# Patient Record
Sex: Male | Born: 1990 | Race: White | Hispanic: No | Marital: Single | Smoking: Former smoker
Health system: Southern US, Community
[De-identification: ages and names within clinical notes are randomized; demographics above are authoritative.]

## PROBLEM LIST (undated history)

## (undated) DIAGNOSIS — F32A Depression, unspecified: Secondary | ICD-10-CM

## (undated) DIAGNOSIS — F329 Major depressive disorder, single episode, unspecified: Secondary | ICD-10-CM

## (undated) DIAGNOSIS — K219 Gastro-esophageal reflux disease without esophagitis: Secondary | ICD-10-CM

## (undated) HISTORY — DX: Depression, unspecified: F32.A

## (undated) HISTORY — DX: Gastro-esophageal reflux disease without esophagitis: K21.9

## (undated) HISTORY — DX: Major depressive disorder, single episode, unspecified: F32.9

---

## 2017-08-30 ENCOUNTER — Encounter (HOSPITAL_COMMUNITY): Payer: Self-pay

## 2017-08-30 ENCOUNTER — Emergency Department (HOSPITAL_COMMUNITY)
Admission: EM | Admit: 2017-08-30 | Discharge: 2017-08-30 | Disposition: A | Discharge status: Home / Self Care | Payer: No Typology Code available for payment source | Attending: Emergency Medicine | Admitting: Emergency Medicine

## 2017-08-30 ENCOUNTER — Emergency Department (HOSPITAL_COMMUNITY): Payer: No Typology Code available for payment source

## 2017-08-30 DIAGNOSIS — F17210 Nicotine dependence, cigarettes, uncomplicated: Secondary | ICD-10-CM | POA: Insufficient documentation

## 2017-08-30 DIAGNOSIS — R0781 Pleurodynia: Secondary | ICD-10-CM

## 2017-08-30 DIAGNOSIS — M79641 Pain in right hand: Secondary | ICD-10-CM | POA: Diagnosis not present

## 2017-08-30 DIAGNOSIS — R0789 Other chest pain: Secondary | ICD-10-CM | POA: Diagnosis not present

## 2017-08-30 DIAGNOSIS — M25511 Pain in right shoulder: Secondary | ICD-10-CM | POA: Insufficient documentation

## 2017-08-30 MED ORDER — IBUPROFEN 800 MG PO TABS
800.0000 mg | ORAL_TABLET | Freq: Once | ORAL | Status: AC
  Administered 2017-08-30: 800 mg via ORAL
  Filled 2017-08-30: qty 1

## 2017-08-30 MED ORDER — IBUPROFEN 800 MG PO TABS: 800.0000 mg | ORAL_TABLET | Freq: Three times a day (TID) | ORAL | 0 refills | Status: DC
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
## 2018-06-29 ENCOUNTER — Encounter: Payer: Self-pay | Admitting: Family Medicine

## 2018-06-29 ENCOUNTER — Other Ambulatory Visit: Payer: Self-pay

## 2018-06-29 ENCOUNTER — Ambulatory Visit: Payer: No Typology Code available for payment source | Admitting: Family Medicine

## 2018-06-29 VITALS — BP 120/68 | HR 69 | Temp 98.0°F | Ht 70.0 in | Wt 195.0 lb

## 2018-06-29 DIAGNOSIS — R3589 Other polyuria: Secondary | ICD-10-CM | POA: Insufficient documentation

## 2018-06-29 DIAGNOSIS — K219 Gastro-esophageal reflux disease without esophagitis: Secondary | ICD-10-CM | POA: Insufficient documentation

## 2018-06-29 DIAGNOSIS — F3341 Major depressive disorder, recurrent, in partial remission: Secondary | ICD-10-CM | POA: Diagnosis not present

## 2018-06-29 DIAGNOSIS — R358 Other polyuria: Secondary | ICD-10-CM | POA: Diagnosis not present

## 2018-06-29 DIAGNOSIS — F329 Major depressive disorder, single episode, unspecified: Secondary | ICD-10-CM | POA: Insufficient documentation

## 2018-06-29 LAB — POCT URINALYSIS DIPSTICK
BILIRUBIN UA: NEGATIVE
Blood, UA: NEGATIVE
Glucose, UA: NEGATIVE
Leukocytes, UA: NEGATIVE
Nitrite, UA: NEGATIVE
PH UA: 6 (ref 5.0–8.0)
Protein, UA: NEGATIVE
Spec Grav, UA: 1.025 (ref 1.010–1.025)
UROBILINOGEN UA: 1 U/dL

## 2018-06-29 LAB — HEMOGLOBIN A1C: Hgb A1c MFr Bld: 5 % (ref 4.6–6.5)

## 2018-06-29 MED ORDER — OMEPRAZOLE 10 MG PO CPDR: 10.0000 mg | DELAYED_RELEASE_CAPSULE | Freq: Every day | ORAL | 2 refills | Status: DC

## 2018-06-29 MED ORDER — ESCITALOPRAM OXALATE 20 MG PO TABS: 20.0000 mg | ORAL_TABLET | Freq: Every day | ORAL | 2 refills | Status: DC

## 2018-06-29 NOTE — Progress Notes (Signed)
Alexander Arroyo [place] - 27 y.o. male MRN [id number]  Date of birth: 06/25/1991  Subjective Chief Complaint  Patient presents with  . Establish Care    trouble sleeping at night bc has to use bathroom often, needs to est care and get a new primary so meds can get refilled. Would like to discuss his meds.    HPI Alexander Arroyo D[place] is a 27 y.o. male with history of GERD and Depression here today to establish care with new PCP.  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  He does report increased urination in the evenings as well.  Denies increased thirst but reports diabetes in several family members.    -Depression:  Current treatment with lexapro.  He is doing well on this and denies side effects.  He reports that he does have some days where he feels sad and depressed.  Denies any changes to sleep or energy levels.  Appetite is good.  Denies SI/HI  -GERD:  Managed well with omeprazole.  Denies increased symptoms of reflux, abdominal pain, nausea or vomiting, or melena.    ROS:  A comprehensive ROS was completed and negative except as noted per HPI   Depression screen [facility] 2/9 06/29/2018 06/29/2018  Decreased Interest 2 0  Down, Depressed, Hopeless 1 0  PHQ - 2 Score 3 0  Altered sleeping 1 -  Tired, decreased energy 2 -  Change in appetite 0 -  Feeling bad or failure about yourself  1 -  Trouble concentrating 3 -  Moving slowly or fidgety/restless 0 -  Suicidal thoughts 0 -  PHQ-9 Score 10 -   GAD 7 : Generalized Anxiety Score 06/29/2018  Nervous, Anxious, on Edge 1  Control/stop worrying 1  Worry too much - different things 3  Trouble relaxing 2  Restless 0  Easily annoyed or irritable 2  Afraid - awful might happen 1  Total GAD 7 Score 10    . No Known Allergies  Past Medical History:  Diagnosis Date  . Depression   . GERD (gastroesophageal reflux disease)     History reviewed. No pertinent surgical history.  Social History   Socioeconomic History  . Marital  status: Single    Spouse name: Not on file  . Number of children: Not on file  . Years of education: Not on file  . Highest education level: Not on file  Occupational History  . Not on file  Social Needs  . Financial resource strain: Not on file  . Food insecurity:    Worry: Not on file    Inability: Not on file  . Transportation needs:    Medical: Not on file    Non-medical: Not on file  Tobacco Use  . Smoking status: Former Smoker    Packs/day: 0.50    Types: Cigarettes  . Smokeless tobacco: Never Used  Substance and Sexual Activity  . 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 Sexual activity: Not on file  Lifestyle  . Physical activity:    Days per week: Not on file    Minutes per session: Not on file  . Stress: Not on file  Relationships  . Social connections:    Talks on phone: Not on file    Gets together: Not on file    Attends religious service: Not on file  Active member of club or organization: Not on file    Attends meetings of clubs or organizations: Not on file    Relationship status: Not on file  Other Topics Concern  . Not on file  Social History Narrative  . Not on file    History reviewed. No pertinent family history.  Health Maintenance  Topic Date Due  . HIV Screening  02/15/2007  . TETANUS/TDAP  02/15/2011  . INFLUENZA VACCINE  06/15/2018    ----------------------------------------------------------------------------------------------------------------------------------------------------------------------------------------------------------------- Physical Exam BP 120/68 (BP Location: Left Arm, Patient Position: Sitting, Cuff Size: Normal)   Pulse 69   Temp 98 F (36.7 C) (Oral)   Ht 5\' 10"  (1.778 m)   Wt 195 lb (88.5 kg)   SpO2 97%   BMI 27.98 kg/m   Physical Exam  Constitutional: He is oriented to person, place, and time. He appears  well-nourished. No distress.  HENT:  Head: Normocephalic and atraumatic.  Mouth/Throat: Oropharynx is clear and moist.  Eyes: No scleral icterus.  Neck: Neck supple. No thyromegaly present.  Cardiovascular: Normal rate, regular rhythm and normal heart sounds.  Pulmonary/Chest: Effort normal and breath sounds normal.  Musculoskeletal: He exhibits no edema.  Lymphadenopathy:    He has no cervical adenopathy.  Neurological: He is alert and oriented to person, place, and time. No cranial nerve deficit.  Skin: Skin is warm and dry.  Psychiatric: He has a normal mood and affect. His behavior is normal.    ------------------------------------------------------------------------------------------------------------------------------------------------------------------------------------------------------------------- Assessment and Plan  MDD (major depressive disorder) Stable with lexapro, continue at current dose.    Polyuria Check UA and A1c  Gastroesophageal reflux disease without esophagitis Stable symptoms on omeprazole, recommend continuation.

## 2018-06-29 NOTE — Assessment & Plan Note (Signed)
Check UA and A1c

## 2018-06-29 NOTE — Patient Instructions (Signed)
Continue current medications See me again in about 6 months or sooner if [facility]

## 2018-06-29 NOTE — Assessment & Plan Note (Signed)
Stable with lexapro, continue at current dose.

## 2018-06-29 NOTE — Assessment & Plan Note (Signed)
Stable symptoms on omeprazole, recommend continuation.

## 2018-07-04 ENCOUNTER — Ambulatory Visit: Payer: Commercial Managed Care - PPO | Admitting: Family Medicine

## 2018-07-04 ENCOUNTER — Encounter: Payer: Self-pay | Admitting: Family Medicine

## 2018-07-04 VITALS — BP 120/78 | HR 77 | Temp 97.9°F | Ht 70.0 in | Wt 195.0 lb

## 2018-07-04 DIAGNOSIS — L03012 Cellulitis of left finger: Secondary | ICD-10-CM | POA: Insufficient documentation

## 2018-07-04 DIAGNOSIS — Z23 Encounter for immunization: Secondary | ICD-10-CM | POA: Diagnosis not present

## 2018-07-04 MED ORDER — CEPHALEXIN 500 MG PO CAPS: 500.0000 mg | ORAL_CAPSULE | Freq: Three times a day (TID) | ORAL | 0 refills | Status: DC

## 2018-07-04 NOTE — Patient Instructions (Signed)
Paronychia  Paronychia is an infection of the skin. It happens near a fingernail or toenail. It may cause pain and swelling around the nail. Usually, it is not serious and it clears up with treatment.  Follow these instructions at home:   Soak the fingers or toes in warm water as told by your doctor. You may be told to do this for 20 minutes, 2-3 times a day.   Keep the area dry when you are not soaking it.   Take medicines only as told by your doctor.   If you were given an antibiotic medicine, finish all of it even if you start to feel better.   Keep the affected area clean.   Do not try to drain a fluid-filled bump yourself.   Wear rubber gloves when putting your hands in water.   Wear gloves if your hands might touch cleaners or chemicals.   Follow your doctor's instructions about:  ? Wound care.  ? Bandage (dressing) changes and removal.  Contact a doctor if:   Your symptoms get worse or do not improve.   You have a fever or chills.   You have redness spreading from the affected area.   You have more fluid, blood, or pus coming from the affected area.   Your finger or knuckle is swollen or is hard to move.  This information is not intended to replace advice given to you by your health care provider. Make sure you discuss any questions you have with your health care provider.  Document Released: 10/20/2009 Document Revised: 04/08/2016 Document Reviewed: 10/09/2014  Elsevier Interactive Patient Education  2018 [employer]

## 2018-07-04 NOTE — Assessment & Plan Note (Addendum)
No improvement with conservative treatment (soaks) at home.  Simple I&D performed today Rx for cephalexin given surrounding cellulitis.  Updated Tdap given.

## 2018-07-04 NOTE — Addendum Note (Signed)
Addended by: Mammie LorenzoMATTHEWS, Tyrel E on: 07/04/2018 09:09 AM   Modules accepted: Orders

## 2018-07-04 NOTE — Progress Notes (Signed)
Alexander Arroyo - 27 y.o. male MRN [id number]  Date of birth: 06/07/1991  Subjective Chief Complaint  Patient presents with  . thumb infection    last friday trried to relieve the pain in his left thumb, now its infected and red, swollen    HPI Alexander Arroyo is a 27 y.o. male here today with complaint of L thumb infection.  Reports that symptoms began last Friday and has progressively become more swollen and tender.  He has tried warm soaks and "popping" area himself without success.  He does have surrounding erythema.  He is unsure if he injured/cut his thumb on anything.  He is unsure of tetanus vaccination status. He denies fever, chills.   ROS:  A comprehensive ROS was completed and negative except as noted per HPI  No Known Allergies  Past Medical History:  Diagnosis Date  . Depression   . GERD (gastroesophageal reflux disease)     No past surgical history on file.  Social History   Socioeconomic History  . Marital status: Single    Spouse name: Not on file  . Number of children: Not on file  . Years of education: Not on file  . Highest education level: Not on file  Occupational History  . Not on file  Social Needs  . Financial resource strain: Not on file  . Food insecurity:    Worry: Not on file    Inability: Not on file  . Transportation needs:    Medical: Not on file    Non-medical: Not on file  Tobacco Use  . Smoking status: Former Smoker    Packs/day: 0.50    Types: Cigarettes  . Smokeless tobacco: Never Used  Substance and Sexual Activity  . 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 Sexual activity: Not on file  Lifestyle  . Physical activity:    Days per week: Not on file    Minutes per session: Not on file  . Stress: Not on file  Relationships  . Social connections:    Talks on phone: Not on file    Gets together: Not on file    Attends religious service: Not  on file    Active member of club or organization: Not on file    Attends meetings of clubs or organizations: Not on file    Relationship status: Not on file  Other Topics Concern  . Not on file  Social History Narrative  . Not on file    No family history on file.  Health Maintenance  Topic Date Due  . HIV Screening  02/15/2007  . TETANUS/TDAP  02/15/2011  . INFLUENZA VACCINE  06/15/2018    ----------------------------------------------------------------------------------------------------------------------------------------------------------------------------------------------------------------- Physical Exam BP 120/78 (BP Location: Left Arm, Patient Position: Sitting, Cuff Size: Normal)   Pulse 77   Temp 97.9 F (36.6 C) (Oral)   Ht 5\' 10"  (1.778 m)   Wt 195 lb (88.5 kg)   SpO2 97%   BMI 27.98 kg/m   Physical Exam  Constitutional: He is oriented to person, place, and time. He appears well-nourished. No distress.  HENT:  Head: Normocephalic and atraumatic.  Eyes: No scleral icterus.  Cardiovascular: Normal rate, regular rhythm and normal heart sounds.  Pulmonary/Chest: Effort normal and breath sounds normal.  Neurological: He is alert and oriented to person, place, and time.  Skin: Skin is warm and dry.  Left thumb with swelling and abscess adjacent to nail fold with surrounding erythema.    Psychiatric: He has a normal mood and affect. His behavior is normal.   Procedure note:  Procedure discussed with patient, verbal consent obtained.  Verified allergies.  ~552mL of 2% lidocaine were used for local anesthesia and I&D was completed using an 18 gauge needle.  Blood and purulent material expressed from area.  Topical antibiotic ointment and band-aid applied.     ------------------------------------------------------------------------------------------------------------------------------------------------------------------------------------------------------------------- Assessment and Plan  Paronychia of left thumb No improvement with conservative treatment (soaks) at home.  Simple I&D performed today Rx for cephalexin given surrounding cellulitis.

## 2018-11-22 ENCOUNTER — Ambulatory Visit (INDEPENDENT_AMBULATORY_CARE_PROVIDER_SITE_OTHER): Payer: Commercial Managed Care - PPO | Admitting: Family Medicine

## 2018-11-22 ENCOUNTER — Encounter: Payer: Self-pay | Admitting: Family Medicine

## 2018-11-22 VITALS — BP 132/74 | HR 75 | Temp 97.5°F | Ht 70.0 in | Wt 193.0 lb

## 2018-11-22 DIAGNOSIS — F3341 Major depressive disorder, recurrent, in partial remission: Secondary | ICD-10-CM

## 2018-11-22 DIAGNOSIS — K219 Gastro-esophageal reflux disease without esophagitis: Secondary | ICD-10-CM

## 2018-11-22 DIAGNOSIS — Z13220 Encounter for screening for lipoid disorders: Secondary | ICD-10-CM | POA: Diagnosis not present

## 2018-11-22 DIAGNOSIS — Z0000 Encounter for general adult medical examination without abnormal findings: Secondary | ICD-10-CM | POA: Diagnosis not present

## 2018-11-22 MED ORDER — PANTOPRAZOLE SODIUM 40 MG PO TBEC: 40.0000 mg | DELAYED_RELEASE_TABLET | Freq: Every day | ORAL | 1 refills | Status: DC

## 2018-11-22 MED ORDER — PANTOPRAZOLE SODIUM 40 MG PO TBEC: 40.0000 mg | DELAYED_RELEASE_TABLET | Freq: Every day | ORAL | 3 refills | Status: DC

## 2018-11-22 MED ORDER — BUPROPION HCL ER (XL) 150 MG PO TB24: 150.0000 mg | ORAL_TABLET | Freq: Every day | ORAL | 1 refills | Status: DC

## 2018-11-22 MED ORDER — BUPROPION HCL ER (XL) 150 MG PO TB24: 150.0000 mg | ORAL_TABLET | Freq: Every day | ORAL | 2 refills | Status: DC

## 2018-11-22 NOTE — Patient Instructions (Signed)

## 2018-11-22 NOTE — Assessment & Plan Note (Signed)
Well adult Orders Placed This Encounter  Procedures  . Comp Met (CMET)  . CBC  . TSH  . Lipid Profile  Immunizations: Up to date Screenings: Lipid  Anticipatory guidance/Risk factor reduction:  Per AVS

## 2018-11-22 NOTE — Progress Notes (Signed)
Alexander Arroyo - 28 y.o. male MRN [id number]  Date of birth: March 11, 1991  Subjective Chief Complaint  Patient presents with  . Annual Exam    wants to discuss lexapro-concerns it's has not been working    HPI Alexander Arroyo is a 28 y.o. male with history of depression and anxiety here today for annual exam.  He reports that he does not feel like lexapro is working as well as it had previously.  Had been fairly stable on this medication up until now.  GERD also is not well controlled with omeprazole.   Has family history of CAD  And HLD with his dad.  He would like to have lab testing completed today.    Depression screen [facility] 2/9 11/22/2018 06/29/2018 06/29/2018  Decreased Interest 3 2 0  Down, Depressed, Hopeless 2 1 0  PHQ - 2 Score 5 3 0  Altered sleeping 1 1 -  Tired, decreased energy 3 2 -  Change in appetite 1 0 -  Feeling bad or failure about yourself  2 1 -  Trouble concentrating 3 3 -  Moving slowly or fidgety/restless 1 0 -  Suicidal thoughts 1 0 -  PHQ-9 Score 17 10 -   GAD 7 : Generalized Anxiety Score 11/22/2018 06/29/2018  Nervous, Anxious, on Edge 3 1  Control/stop worrying 3 1  Worry too much - different things 3 3  Trouble relaxing 3 2  Restless 1 0  Easily annoyed or irritable 2 2  Afraid - awful might happen 1 1  Total GAD 7 Score 16 10      Review of Systems  Constitutional: Negative for chills, fever, malaise/fatigue and weight loss.  HENT: Negative for congestion, ear pain and sore throat.   Eyes: Negative for blurred vision, double vision and pain.  Respiratory: Negative for cough and shortness of breath.   Cardiovascular: Negative for chest pain and palpitations.  Gastrointestinal: Negative for abdominal pain, blood in stool, constipation, heartburn and nausea.  Genitourinary: Negative for dysuria and urgency.  Musculoskeletal: Negative for joint pain and myalgias.  Neurological: Negative for dizziness and headaches.  Endo/Heme/Allergies: Does not  bruise/bleed easily.  Psychiatric/Behavioral: Positive for depression. Negative for suicidal ideas. The patient is nervous/anxious. The patient does not have insomnia.     No Known Allergies  Past Medical History:  Diagnosis Date  . Depression   . GERD (gastroesophageal reflux disease)     History reviewed. No pertinent surgical history.  Social History   Socioeconomic History  . Marital status: Single    Spouse name: Not on file  . Number of children: Not on file  . Years of education: Not on file  . Highest education level: Not on file  Occupational History  . Not on file  Social Needs  . Financial resource strain: Not on file  . Food insecurity:    Worry: Not on file    Inability: Not on file  . Transportation needs:    Medical: Not on file    Non-medical: Not on file  Tobacco Use  . Smoking status: Former Smoker    Packs/day: 0.50    Types: Cigarettes  . Smokeless tobacco: Never Used  Substance and Sexual Activity  . 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 Sexual activity: Not on file  Lifestyle  . Physical activity:    Days per  week: Not on file    Minutes per session: Not on file  . Stress: Not on file  Relationships  . Social connections:    Talks on phone: Not on file    Gets together: Not on file    Attends religious service: Not on file    Active member of club or organization: Not on file    Attends meetings of clubs or organizations: Not on file    Relationship status: Not on file  Other Topics Concern  . Not on file  Social History Narrative  . Not on file    History reviewed. No pertinent family history.  Health Maintenance  Topic Date Due  . HIV Screening  02/15/2007  . TETANUS/TDAP  07/04/2028  . INFLUENZA VACCINE  Completed     ----------------------------------------------------------------------------------------------------------------------------------------------------------------------------------------------------------------- Physical Exam BP 132/74   Pulse 75   Temp (!) 97.5 F (36.4 C) (Oral)   Ht 5' 10"  (1.778 m)   Wt 193 lb (87.5 kg)   SpO2 96%   BMI 27.69 kg/m   Physical Exam Constitutional:      General: He is not in acute distress. HENT:     Head: Normocephalic and atraumatic.     Right Ear: External ear normal.     Left Ear: External ear normal.  Eyes:     General: No scleral icterus. Neck:     Musculoskeletal: Normal range of motion.     Thyroid: No thyromegaly.  Cardiovascular:     Rate and Rhythm: Normal rate and regular rhythm.     Heart sounds: Normal heart sounds.  Pulmonary:     Effort: Pulmonary effort is normal.     Breath sounds: Normal breath sounds.  Abdominal:     General: Bowel sounds are normal. There is no distension.     Palpations: Abdomen is soft.     Tenderness: There is no abdominal tenderness. There is no guarding.  Lymphadenopathy:     Cervical: No cervical adenopathy.  Skin:    General: Skin is warm and dry.     Findings: No rash.  Neurological:     Mental Status: He is alert and oriented to person, place, and time.     Cranial Nerves: No cranial nerve deficit.     Motor: No abnormal muscle tone.  Psychiatric:        Behavior: Behavior normal.     ------------------------------------------------------------------------------------------------------------------------------------------------------------------------------------------------------------------- Assessment and Plan  Gastroesophageal reflux disease without esophagitis -Change omeprazole to pantoprazole.   Well adult exam Well adult Orders Placed This Encounter  Procedures  . Comp Met (CMET)  . CBC  . TSH  . Lipid Profile  Immunizations: Up to date Screenings: Lipid   Anticipatory guidance/Risk factor reduction:  Per AVS  MDD (major depressive disorder) Worsening depressive symptoms -Add on bupropion for augmentation of lexapro -F/u in 4 weeks

## 2018-11-22 NOTE — Assessment & Plan Note (Signed)
-  Change omeprazole to pantoprazole. 

## 2018-11-22 NOTE — Assessment & Plan Note (Signed)
Worsening depressive symptoms -Add on bupropion for augmentation of lexapro -F/u in 4 weeks

## 2018-11-23 ENCOUNTER — Other Ambulatory Visit: Payer: Self-pay | Admitting: Family Medicine

## 2018-11-23 LAB — CBC
HEMATOCRIT: 47.1 % (ref 39.0–52.0)
Hemoglobin: 16.5 g/dL (ref 13.0–17.0)
MCHC: 35 g/dL (ref 30.0–36.0)
MCV: 86.2 fl (ref 78.0–100.0)
PLATELETS: 184 10*3/uL (ref 150.0–400.0)
RBC: 5.46 Mil/uL (ref 4.22–5.81)
RDW: 13 % (ref 11.5–15.5)
WBC: 5.6 10*3/uL (ref 4.0–10.5)

## 2018-11-23 LAB — LIPID PANEL
Cholesterol: 195 mg/dL (ref 0–200)
HDL: 46.6 mg/dL (ref >39.00)
LDL Cholesterol: 116 mg/dL — ABNORMAL HIGH (ref 0–99)
NONHDL: 148.69
Total CHOL/HDL Ratio: 4
Triglycerides: 164 mg/dL — ABNORMAL HIGH (ref 0.0–149.0)
VLDL: 32.8 mg/dL (ref 0.0–40.0)

## 2018-11-23 LAB — COMPREHENSIVE METABOLIC PANEL
ALBUMIN: 4.7 g/dL (ref 3.5–5.2)
ALT: 28 U/L (ref 0–53)
AST: 26 U/L (ref 0–37)
Alkaline Phosphatase: 66 U/L (ref 39–117)
BUN: 25 mg/dL — ABNORMAL HIGH (ref 6–23)
CALCIUM: 10.1 mg/dL (ref 8.4–10.5)
CHLORIDE: 103 meq/L (ref 96–112)
CO2: 26 meq/L (ref 19–32)
Creatinine, Ser: 1.01 mg/dL (ref 0.40–1.50)
GFR: 94.34 mL/min (ref >60.00)
Glucose, Bld: 79 mg/dL (ref 70–99)
POTASSIUM: 4 meq/L (ref 3.5–5.1)
Sodium: 139 mEq/L (ref 135–145)
Total Bilirubin: 0.4 mg/dL (ref 0.2–1.2)
Total Protein: 7.1 g/dL (ref 6.0–8.3)

## 2018-11-23 LAB — TSH: TSH: 1.21 u[IU]/mL (ref 0.35–4.50)

## 2018-11-23 MED ORDER — ESCITALOPRAM OXALATE 20 MG PO TABS: 20.0000 mg | ORAL_TABLET | Freq: Every day | ORAL | 1 refills | Status: DC

## 2018-11-23 NOTE — Telephone Encounter (Signed)
Copied from CRM [id number]. Topic: Quick Communication - Rx Refill/Question >> Nov 23, 2018  3:52 PM Jilda Roche wrote: Medication: escitalopram (LEXAPRO) 20 MG tablet  Has the patient contacted their pharmacy? Yes.   (Agent: If no, request that the patient contact the pharmacy for the refill.) (Agent: If yes, when and what did the pharmacy advise?) Call office  Preferred Pharmacy (with phone number or street name): [facility] - [place], [place] - [address] [phone] (Phone) [phone] (Fax)    Agent: Please be advised that RX refills may take up to 3 business days. We ask that you follow-up with your pharmacy.

## 2018-11-27 NOTE — Progress Notes (Signed)
-  Mild elevation in cholesterol, recommend low fat diet with regular exercise.   -Other labs are normal.

## 2018-12-04 ENCOUNTER — Other Ambulatory Visit: Payer: Self-pay | Admitting: Family Medicine

## 2018-12-04 MED ORDER — ESCITALOPRAM OXALATE 20 MG PO TABS: 20.0000 mg | ORAL_TABLET | Freq: Every day | ORAL | 0 refills | Status: DC

## 2018-12-04 NOTE — Telephone Encounter (Signed)
Short term Rx sent to local pharmacy while awaiting mail order Rx

## 2018-12-04 NOTE — Telephone Encounter (Signed)
Copied from CRM [id number]. Topic: Quick Communication - Rx Refill/Question >> Dec 04, 2018  9:40 AM Jaquita Rector A wrote: Medication: escitalopram (LEXAPRO) 20 MG tablet   Per patient he missed 2 days and would like an Rx sent to local pharmacy [facility]  Has the patient contacted their pharmacy? Yes.   (Agent: If no, request that the patient contact the pharmacy for the refill.) (Agent: If yes, when and what did the pharmacy advise?)  Preferred Pharmacy (with phone number or street name): [facility] [address], [place] - [address] [place] [phone] (Phone) [phone] (Fax)    Agent: Please be advised that RX refills may take up to 3 business days. We ask that you follow-up with your pharmacy.

## 2019-01-01 ENCOUNTER — Ambulatory Visit: Payer: No Typology Code available for payment source | Admitting: Family Medicine

## 2019-05-18 ENCOUNTER — Other Ambulatory Visit: Payer: Self-pay | Admitting: Family Medicine
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
## 2019-06-14 ENCOUNTER — Encounter: Payer: Self-pay | Admitting: Family Medicine

## 2019-06-14 ENCOUNTER — Telehealth (INDEPENDENT_AMBULATORY_CARE_PROVIDER_SITE_OTHER): Payer: Commercial Managed Care - PPO | Admitting: Family Medicine

## 2019-06-14 DIAGNOSIS — F3341 Major depressive disorder, recurrent, in partial remission: Secondary | ICD-10-CM

## 2019-06-14 DIAGNOSIS — K219 Gastro-esophageal reflux disease without esophagitis: Secondary | ICD-10-CM

## 2019-06-14 MED ORDER — BUPROPION HCL ER (XL) 150 MG PO TB24: 150.0000 mg | ORAL_TABLET | Freq: Every day | ORAL | 0 refills | Status: DC

## 2019-06-14 MED ORDER — ESCITALOPRAM OXALATE 20 MG PO TABS: 20.0000 mg | ORAL_TABLET | Freq: Every day | ORAL | 0 refills | Status: DC

## 2019-06-14 MED ORDER — PANTOPRAZOLE SODIUM 40 MG PO TBEC: 40.0000 mg | DELAYED_RELEASE_TABLET | Freq: Every day | ORAL | 0 refills | Status: DC

## 2019-06-14 MED ORDER — ESCITALOPRAM OXALATE 20 MG PO TABS: 20.0000 mg | ORAL_TABLET | Freq: Every day | ORAL | 2 refills | Status: DC

## 2019-06-14 MED ORDER — BUPROPION HCL ER (XL) 150 MG PO TB24: 150.0000 mg | ORAL_TABLET | Freq: Every day | ORAL | 2 refills | Status: DC

## 2019-06-14 MED ORDER — PANTOPRAZOLE SODIUM 40 MG PO TBEC: 40.0000 mg | DELAYED_RELEASE_TABLET | Freq: Every day | ORAL | 2 refills | Status: DC

## 2019-06-14 NOTE — Progress Notes (Signed)
Alexander KassCody Melikian - 28 y.o. male MRN [id number]  Date of birth: 05/11/1991   This visit type was conducted due to national recommendations for restrictions regarding the COVID-19 Pandemic (e.g. social distancing).  This format is felt to be most appropriate for this patient at this time.  All issues noted in this document were discussed and addressed.  No physical exam was performed (except for noted visual exam findings with Video Visits).  I discussed the limitations of evaluation and management by telemedicine and the availability of in person appointments. The patient expressed understanding and agreed to proceed.  I connected with@ on 06/14/19 at  9:00 AM EDT by a video enabled telemedicine application and verified that I am speaking with the correct person using two identifiers.   Patient Location[place] [address] [place] [id number]   Provider location:   Home office  Chief Complaint  Patient presents with  . Follow-up    Pt agrees to virtual visit.  He is wanting to F/U with depression and GERD. He feels that that the current depression regimen works great and would like to continue. The Pantoprazole is significantly more helpful than the previous medication. He is needing 2-week supply of all meds to [facility] and a 90d supply to [facility].    HPI  Alexander Arroyo is a 28 y.o. male who presents via audio/video conferencing for a telehealth visit today.  He is following up today for GERD and depression/anxiety.  He states he is doing very well.  GERD symptoms are well controlled with current dose of protonix.  He denies any breakthrough symptoms at this time.   Depression and anxiety are well managed with combination of lexapro and bupropion.  He is not experiencing any side effects with these medications.     ROS:  A comprehensive ROS was completed and negative except as noted per HPI  Past Medical History:  Diagnosis Date  . Depression   . GERD (gastroesophageal  reflux disease)     No past surgical history on file.  No family history on file.  Social History   Socioeconomic History  . Marital status: Single    Spouse name: Not on file  . Number of children: Not on file  . Years of education: Not on file  . Highest education level: Not on file  Occupational History  . Not on file  Social Needs  . Financial resource strain: Not on file  . Food insecurity    Worry: Not on file    Inability: Not on file  . Transportation needs    Medical: Not on file    Non-medical: Not on file  Tobacco Use  . Smoking status: Former Smoker    Packs/day: 0.50    Types: Cigarettes  . Smokeless tobacco: Never Used  Substance and Sexual Activity  . 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 Sexual activity: Not on file  Lifestyle  . Physical activity    Days per week: Not on file    Minutes per session: Not on file  . Stress: Not on file  Relationships  . Social Musicianconnections    Talks on phone: Not on file    Gets together: Not on file    Attends religious service: Not on file    Active member of club or organization: Not on file  Attends meetings of clubs or organizations: Not on file    Relationship status: Not on file  . Intimate partner violence    Fear of current or ex partner: Not on file    Emotionally abused: Not on file    Physically abused: Not on file    Forced sexual activity: Not on file  Other Topics Concern  . Not on file  Social History Narrative  . Not on file     Current Outpatient Medications:  .  buPROPion (WELLBUTRIN XL) 150 MG 24 hr tablet, Take 1 tablet (150 mg total) by mouth daily., Disp: 90 tablet, Rfl: 2 .  escitalopram (LEXAPRO) 20 MG tablet, Take 1 tablet (20 mg total) by mouth daily., Disp: 90 tablet, Rfl: 2 .  pantoprazole (PROTONIX) 40 MG tablet, Take 1 tablet (40 mg total) by mouth daily., Disp: 90 tablet, Rfl: 2   EXAM:  VITALS per patient if applicable: Temp 68.0 F (37.1 C) (Oral)   Wt 182 lb (82.6 kg)   BMI 26.11 kg/m   GENERAL: alert, oriented, appears well and in no acute distress  HEENT: atraumatic, conjunttiva clear, no obvious abnormalities on inspection of external nose and ears  NECK: normal movements of the head and neck  LUNGS: on inspection no signs of respiratory distress, breathing rate appears normal, no obvious gross SOB, gasping or wheezing  CV: no obvious cyanosis  MS: moves all visible extremities without noticeable abnormality  PSYCH/NEURO: pleasant and cooperative, no obvious depression or anxiety, speech and thought processing grossly intact  ASSESSMENT AND PLAN:  Discussed the following assessment and plan:  No problem-specific Assessment & Plan notes found for this encounter.       I discussed the assessment and treatment plan with the patient. The patient was provided an opportunity to ask questions and all were answered. The patient agreed with the plan and demonstrated an understanding of the instructions.   The patient was advised to call back or seek an in-person evaluation if the symptoms worsen or if the condition fails to improve as anticipated.    Luetta Nutting, DO

## 2019-06-14 NOTE — Assessment & Plan Note (Signed)
-  Symptoms are well controlled with current dose of lexapro and bupropion.  Will continue with reassessment in 6 months.

## 2019-06-14 NOTE — Assessment & Plan Note (Signed)
-  Better controlled with pantoprazole, will continue for now.

## 2020-03-01 ENCOUNTER — Other Ambulatory Visit: Payer: Self-pay | Admitting: Family Medicine

## 2020-03-04 NOTE — Telephone Encounter (Signed)
Unable to leave vm, need to offer TOC appt for the pt. Last ov with Dr. Ashley Royalty was 05/2019--to schedule f/u in 6 months.

## 2020-03-06 ENCOUNTER — Ambulatory Visit: Payer: Commercial Managed Care - PPO | Admitting: Family Medicine
